# Patient Record
Sex: Male | Born: 1960 | Race: Black or African American | Hispanic: No | Marital: Married
Health system: Southern US, Community
[De-identification: ages and names within clinical notes are randomized; demographics above are authoritative.]

---

## 2019-08-15 ENCOUNTER — Emergency Department (HOSPITAL_COMMUNITY)
Admission: EM | Admit: 2019-08-15 | Discharge: 2019-08-15 | Disposition: A | Discharge status: Home / Self Care | Payer: No Typology Code available for payment source | Attending: Emergency Medicine | Admitting: Emergency Medicine

## 2019-08-15 ENCOUNTER — Emergency Department (HOSPITAL_COMMUNITY): Payer: No Typology Code available for payment source

## 2019-08-15 DIAGNOSIS — M545 Low back pain: Secondary | ICD-10-CM | POA: Insufficient documentation

## 2019-08-15 DIAGNOSIS — Y9389 Activity, other specified: Secondary | ICD-10-CM | POA: Insufficient documentation

## 2019-08-15 DIAGNOSIS — Y92410 Unspecified street and highway as the place of occurrence of the external cause: Secondary | ICD-10-CM | POA: Insufficient documentation

## 2019-08-15 DIAGNOSIS — R202 Paresthesia of skin: Secondary | ICD-10-CM | POA: Diagnosis not present

## 2019-08-15 DIAGNOSIS — Z041 Encounter for examination and observation following transport accident: Secondary | ICD-10-CM | POA: Diagnosis present

## 2019-08-15 DIAGNOSIS — Y999 Unspecified external cause status: Secondary | ICD-10-CM | POA: Insufficient documentation

## 2019-08-15 MED ORDER — CYCLOBENZAPRINE HCL 10 MG PO TABS
10.0000 mg | ORAL_TABLET | Freq: Two times a day (BID) | ORAL | 0 refills | Status: AC | PRN
Start: 2019-08-15 — End: 2019-08-22

## 2019-08-15 MED ORDER — MORPHINE SULFATE (PF) 4 MG/ML IV SOLN
4.0000 mg | Freq: Once | INTRAVENOUS | Status: AC
  Administered 2019-08-15: 4 mg via INTRAVENOUS
  Filled 2019-08-15: qty 1

## 2019-08-15 MED ORDER — NAPROXEN 500 MG PO TABS
500.0000 mg | ORAL_TABLET | Freq: Two times a day (BID) | ORAL | 0 refills | Status: AC
Start: 2019-08-15 — End: ?
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
## 2019-08-15 NOTE — ED Notes (Signed)
Pt ambulatory.
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
